# Patient Record
Sex: Male | Born: 1970 | Race: Black or African American | Hispanic: No | Marital: Single | Smoking: Current every day smoker
Health system: Southern US, Community
[De-identification: ages and names within clinical notes are randomized; demographics above are authoritative.]

## PROBLEM LIST (undated history)

## (undated) DIAGNOSIS — I219 Acute myocardial infarction, unspecified: Secondary | ICD-10-CM

## (undated) HISTORY — PX: ANGIOPLASTY: SHX39

---

## 2018-05-25 ENCOUNTER — Encounter (HOSPITAL_COMMUNITY): Payer: Self-pay | Admitting: Emergency Medicine

## 2018-05-25 ENCOUNTER — Emergency Department (HOSPITAL_COMMUNITY)
Admission: EM | Admit: 2018-05-25 | Discharge: 2018-05-25 | Disposition: A | Discharge status: Home / Self Care | Payer: Self-pay | Attending: Emergency Medicine | Admitting: Emergency Medicine

## 2018-05-25 ENCOUNTER — Other Ambulatory Visit: Payer: Self-pay

## 2018-05-25 ENCOUNTER — Emergency Department (HOSPITAL_COMMUNITY): Payer: Self-pay

## 2018-05-25 DIAGNOSIS — I252 Old myocardial infarction: Secondary | ICD-10-CM | POA: Insufficient documentation

## 2018-05-25 DIAGNOSIS — F17210 Nicotine dependence, cigarettes, uncomplicated: Secondary | ICD-10-CM | POA: Insufficient documentation

## 2018-05-25 DIAGNOSIS — R072 Precordial pain: Secondary | ICD-10-CM | POA: Insufficient documentation

## 2018-05-25 HISTORY — DX: Acute myocardial infarction, unspecified: I21.9

## 2018-05-25 LAB — DIFFERENTIAL
Abs Immature Granulocytes: 0.03 10*3/uL (ref 0.00–0.07)
Basophils Absolute: 0 10*3/uL (ref 0.0–0.1)
Basophils Relative: 1 %
Eosinophils Absolute: 0.2 10*3/uL (ref 0.0–0.5)
Eosinophils Relative: 2 %
Immature Granulocytes: 0 %
LYMPHS PCT: 27 %
Lymphs Abs: 1.8 10*3/uL (ref 0.7–4.0)
Monocytes Absolute: 0.6 10*3/uL (ref 0.1–1.0)
Monocytes Relative: 8 %
Neutro Abs: 4.2 10*3/uL (ref 1.7–7.7)
Neutrophils Relative %: 62 %

## 2018-05-25 LAB — I-STAT TROPONIN, ED
Troponin i, poc: 0 ng/mL (ref 0.00–0.08)
Troponin i, poc: 0 ng/mL (ref 0.00–0.08)

## 2018-05-25 LAB — RAPID URINE DRUG SCREEN, HOSP PERFORMED
Amphetamines: NOT DETECTED
BARBITURATES: NOT DETECTED
Benzodiazepines: NOT DETECTED
Cocaine: NOT DETECTED
Opiates: NOT DETECTED
TETRAHYDROCANNABINOL: NOT DETECTED

## 2018-05-25 LAB — COMPREHENSIVE METABOLIC PANEL
ALT: 16 U/L (ref 0–44)
AST: 23 U/L (ref 15–41)
Albumin: 4.2 g/dL (ref 3.5–5.0)
Alkaline Phosphatase: 92 U/L (ref 38–126)
Anion gap: 9 (ref 5–15)
BUN: 11 mg/dL (ref 6–20)
CO2: 26 mmol/L (ref 22–32)
Calcium: 9 mg/dL (ref 8.9–10.3)
Chloride: 105 mmol/L (ref 98–111)
Creatinine, Ser: 1.22 mg/dL (ref 0.61–1.24)
GFR calc Af Amer: >60 mL/min (ref >60)
GFR calc non Af Amer: >60 mL/min (ref >60)
Glucose, Bld: 100 mg/dL — ABNORMAL HIGH (ref 70–99)
Potassium: 3.9 mmol/L (ref 3.5–5.1)
Sodium: 140 mmol/L (ref 135–145)
Total Bilirubin: 0.5 mg/dL (ref 0.3–1.2)
Total Protein: 7 g/dL (ref 6.5–8.1)

## 2018-05-25 LAB — CBC
HCT: 44.1 % (ref 39.0–52.0)
Hemoglobin: 14.4 g/dL (ref 13.0–17.0)
MCH: 28.2 pg (ref 26.0–34.0)
MCHC: 32.7 g/dL (ref 30.0–36.0)
MCV: 86.5 fL (ref 80.0–100.0)
Platelets: 246 10*3/uL (ref 150–400)
RBC: 5.1 MIL/uL (ref 4.22–5.81)
RDW: 14.8 % (ref 11.5–15.5)
WBC: 6.8 10*3/uL (ref 4.0–10.5)
nRBC: 0 % (ref 0.0–0.2)

## 2018-05-25 LAB — LIPASE, BLOOD: Lipase: 38 U/L (ref 11–51)

## 2018-05-25 NOTE — ED Triage Notes (Signed)
Pt reports chest that started yesterday and continued today. Pt reports prior hx of MI with angioplasty. Pt also reporting abdominal pain with diarrhea that started today.

## 2018-05-25 NOTE — ED Provider Notes (Signed)
[facility]-EMERGENCY DEPT Provider Note   CSN: [id number] Arrival date & time: 05/25/18  0543     History   Chief Complaint Chief Complaint  Patient presents with  . Chest Pain  . Abdominal Pain    HPI Randall Higgins is a 48 y.o. male.  Patient with history of myocardial infarction in 2015 in [place], treated with angioplasty, presents emergency department with complaint of chest pressure and abdominal pain starting yesterday.  Patient reports pressure across his mid chest.  At times he will feel stabbing pain in the left lateral chest.  He states that his current symptoms are different than when he had a heart attack.  It was more of a stabbing pain in the middle chest.  Patient also has a tight sensation in the abdomen generally.  No associated fevers, cough, shortness of breath.  No diaphoresis or radiation of pain.  No nausea, vomiting, or diarrhea.  No treatments prior to arrival.  Patient is a smoker.  He states that he is supposed to be on medications but has not had them in about 3 months.  He states that he is currently living in inpatient rehab.  States prior use of crack cocaine.  Denies any current use.  Pain and pressure currently resolved.      Past Medical History:  Diagnosis Date  . MI (myocardial infarction) (HCC)     There are no active problems to display for this patient.   Past Surgical History:  Procedure Laterality Date  . ANGIOPLASTY          Home Medications    Prior to Admission medications   Not on File    Family History History reviewed. No pertinent family history.  Social History Social History   Tobacco Use  . Smoking status: Current Every Day Smoker    Types: Cigarettes  . Smokeless tobacco: Never Used  Substance Use Topics  . Alcohol use: Never    Frequency: Never  . Drug use: Never     Allergies   Patient has no known allergies.   Review of Systems Review of Systems  Constitutional: Negative  for diaphoresis and fever.  Eyes: Negative for redness.  Respiratory: Negative for cough and shortness of breath.   Cardiovascular: Positive for chest pain. Negative for palpitations and leg swelling.  Gastrointestinal: Positive for abdominal pain. Negative for nausea and vomiting.  Genitourinary: Negative for dysuria.  Musculoskeletal: Negative for back pain and neck pain.  Skin: Negative for rash.  Neurological: Negative for syncope and light-headedness.  Psychiatric/Behavioral: The patient is not nervous/anxious.      Physical Exam Updated Vital Signs BP (!) 141/97 (BP Location: Left Arm)   Pulse 82   Temp 97.8 F (36.6 C) (Oral)   Resp 17   Ht 5\' 11"  (1.803 m)   Wt 72.6 kg   SpO2 98%   BMI 22.32 kg/m   Physical Exam Vitals signs and nursing note reviewed.  Constitutional:      Appearance: He is well-developed.  HENT:     Head: Normocephalic and atraumatic.  Eyes:     General:        Right eye: No discharge.        Left eye: No discharge.     Conjunctiva/sclera: Conjunctivae normal.  Neck:     Musculoskeletal: Normal range of motion and neck supple.  Cardiovascular:     Rate and Rhythm: Normal rate and regular rhythm.     Heart sounds: Normal heart  sounds.  Pulmonary:     Effort: Pulmonary effort is normal.     Breath sounds: Normal breath sounds. No decreased breath sounds, wheezing or rhonchi.  Abdominal:     Palpations: Abdomen is soft.     Tenderness: There is no abdominal tenderness.  Skin:    General: Skin is warm and dry.  Neurological:     Mental Status: He is alert.      ED Treatments / Results  Labs (all labs ordered are listed, but only abnormal results are displayed) Labs Reviewed  CBC  COMPREHENSIVE METABOLIC PANEL  LIPASE, BLOOD  DIFFERENTIAL  RAPID URINE DRUG SCREEN, HOSP PERFORMED  I-STAT TROPONIN, ED    EKG EKG Interpretation  Date/Time:  Monday May 25 2018 05:55:20 EST Ventricular Rate:  83 PR Interval:    QRS  Duration: 97 QT Interval:  375 QTC Calculation: 441 R Axis:   64 Text Interpretation:  Sinus rhythm Confirmed by Zadie Rhine ([id number]) on 05/25/2018 5:58:39 AM  Radiology Dg Chest 2 View  Result Date: 05/25/2018 CLINICAL DATA:  48 y/o  M; chest pain, abdominal pain, diarrhea. EXAM: CHEST - 2 VIEW COMPARISON:  None. FINDINGS: The heart size and mediastinal contours are within normal limits. Both lungs are clear. The visualized skeletal structures are unremarkable. IMPRESSION: No acute pulmonary process identified. Electronically Signed   By: Mitzi Hansen M.D.   On: 05/25/2018 06:17    Procedures Procedures (including critical care time)  Medications Ordered in ED Medications - No data to display   Initial Impression / Assessment and Plan / ED Course  I have reviewed the triage vital signs and the nursing notes.  Pertinent labs & imaging results that were available during my care of the patient were reviewed by me and considered in my medical decision making (see chart for details).     Patient seen and examined. Work-up initiated. EKG reviewed.   Vital signs reviewed and are as follows: BP (!) 141/97 (BP Location: Left Arm)   Pulse 82   Temp 97.8 F (36.6 C) (Oral)   Resp 17   Ht 5\' 11"  (1.803 m)   Wt 72.6 kg   SpO2 98%   BMI 22.32 kg/m   10:35 AM delta troponin and repeat EKG are unchanged.  Patient remains pain-free.  Ready for discharged home at this time.  ED ECG REPORT   Date: 05/25/2018  Rate: 81  Rhythm: normal sinus rhythm  QRS Axis: normal  Intervals: normal  ST/T Wave abnormalities: normal  Conduction Disutrbances:none  Narrative Interpretation:   Old EKG Reviewed: unchanged  I have personally reviewed the EKG tracing and agree with the computerized printout as noted.  Health and wellness referral given to establish care and restart medications.  Blood pressure relatively controlled today.  Patient was counseled to return with severe  chest pain, especially if the pain is crushing or pressure-like and spreads to the arms, back, neck, or jaw, or if they have sweating, nausea, or shortness of breath with the pain. They were encouraged to call 911 with these symptoms.   They were also told to return if their chest pain gets worse and does not go away with rest, they have an attack of chest pain lasting longer than usual despite rest and treatment with the medications their caregiver has prescribed, if they wake from sleep with chest pain or shortness of breath, if they feel dizzy or faint, if they have chest pain not typical of their usual pain, or if  they have any other emergent concerns regarding their health.  The patient verbalized understanding and agreed.    Final Clinical Impressions(s) / ED Diagnoses   Final diagnoses:  Precordial pain   Patient with chest pain, atypical symptoms, previous MI.  EKG and troponin negative x2.  Chest x-ray is clear.  Patient now pain-free.  Low concern for ACS, PE, dissection.  No signs of DVT.  No signs of pneumonia, pneumothorax, or other problems.  ED Discharge Orders    None       Renne Crigler, Cordelia Poche 05/25/18 1037    Zadie Rhine, MD 05/25/18 2314

## 2018-05-25 NOTE — Discharge Instructions (Signed)
Please read and follow all provided instructions.  Your diagnoses today include:  1. Precordial pain     Tests performed today include:  An EKG of your heart  A chest x-ray  Cardiac enzymes - a blood test for heart muscle damage  Blood counts and electrolytes  Vital signs. See below for your results today.   Medications prescribed:   None  Take any prescribed medications only as directed.  Follow-up instructions: Please follow-up with your primary care provider as soon as you can for further evaluation of your symptoms.   Return instructions:  SEEK IMMEDIATE MEDICAL ATTENTION IF:  You have severe chest pain, especially if the pain is crushing or pressure-like and spreads to the arms, back, neck, or jaw, or if you have sweating, nausea (feeling sick to your stomach), or shortness of breath. THIS IS AN EMERGENCY. Don't wait to see if the pain will go away. Get medical help at once. Call 911 or [phone] (operator). DO NOT drive yourself to the hospital.   Your chest pain gets worse and does not go away with rest.   You have an attack of chest pain lasting longer than usual, despite rest and treatment with the medications your caregiver has prescribed.   You wake from sleep with chest pain or shortness of breath.  You feel dizzy or faint.  You have chest pain not typical of your usual pain for which you originally saw your caregiver.   You have any other emergent concerns regarding your health.  Additional Information: Chest pain comes from many different causes. Your caregiver has diagnosed you as having chest pain that is not specific for one problem, but does not require admission.  You are at low risk for an acute heart condition or other serious illness.   Your vital signs today were: BP 125/90    Pulse 77    Temp 97.8 F (36.6 C) (Oral)    Resp 12    Ht 5\' 11"  (1.803 m)    Wt 72.6 kg    SpO2 99%    BMI 22.32 kg/m  If your blood pressure (BP) was elevated above 135/85  this visit, please have this repeated by your doctor within one month. --------------

## 2020-08-02 IMAGING — CR DG CHEST 2V
2 series · 2 of 2 positions shown · non-contrast
Comparison: None.

CLINICAL DATA: 47 y/o  M; chest pain, abdominal pain, diarrhea.

EXAM:
CHEST - 2 VIEW

[w chest pa]
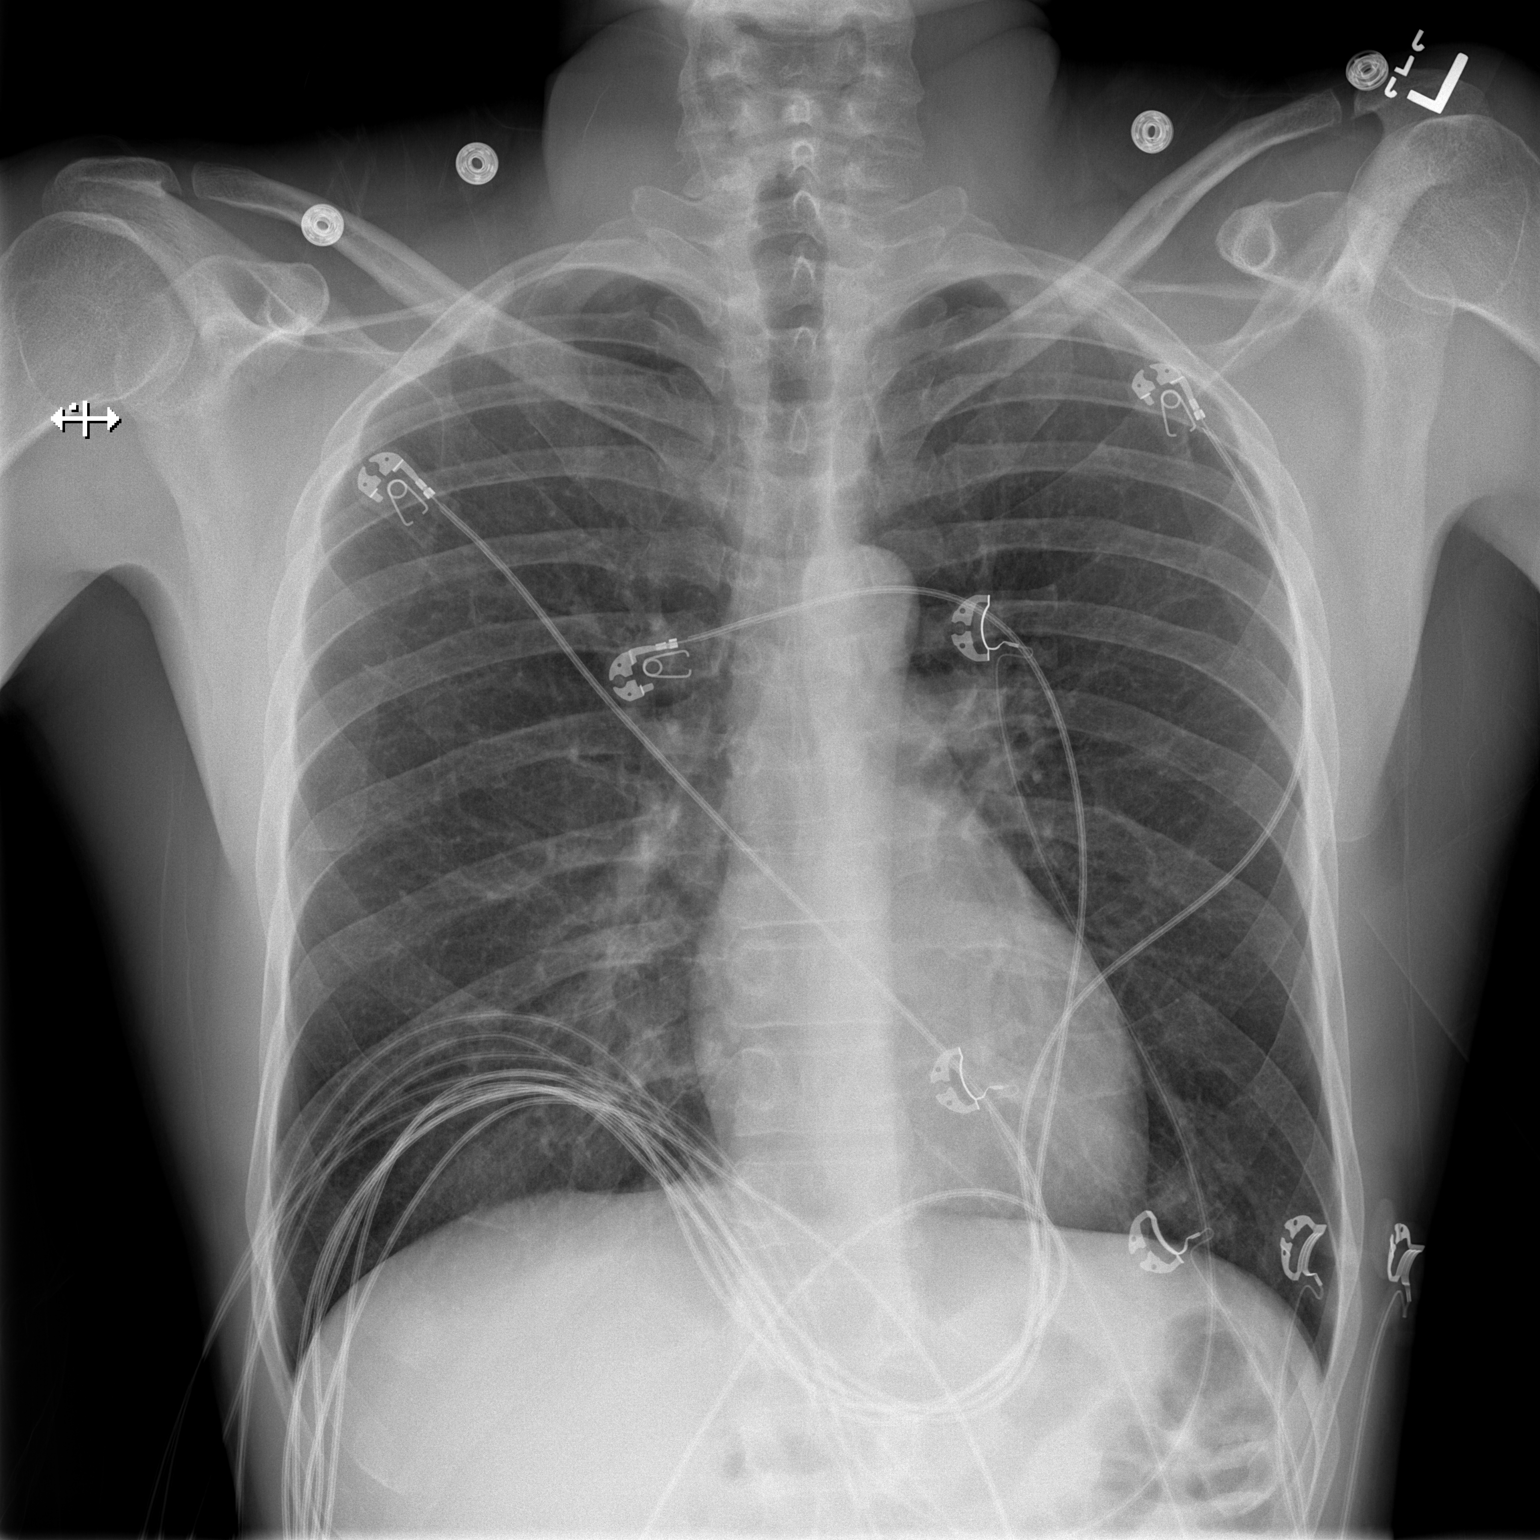

[w chest lat]
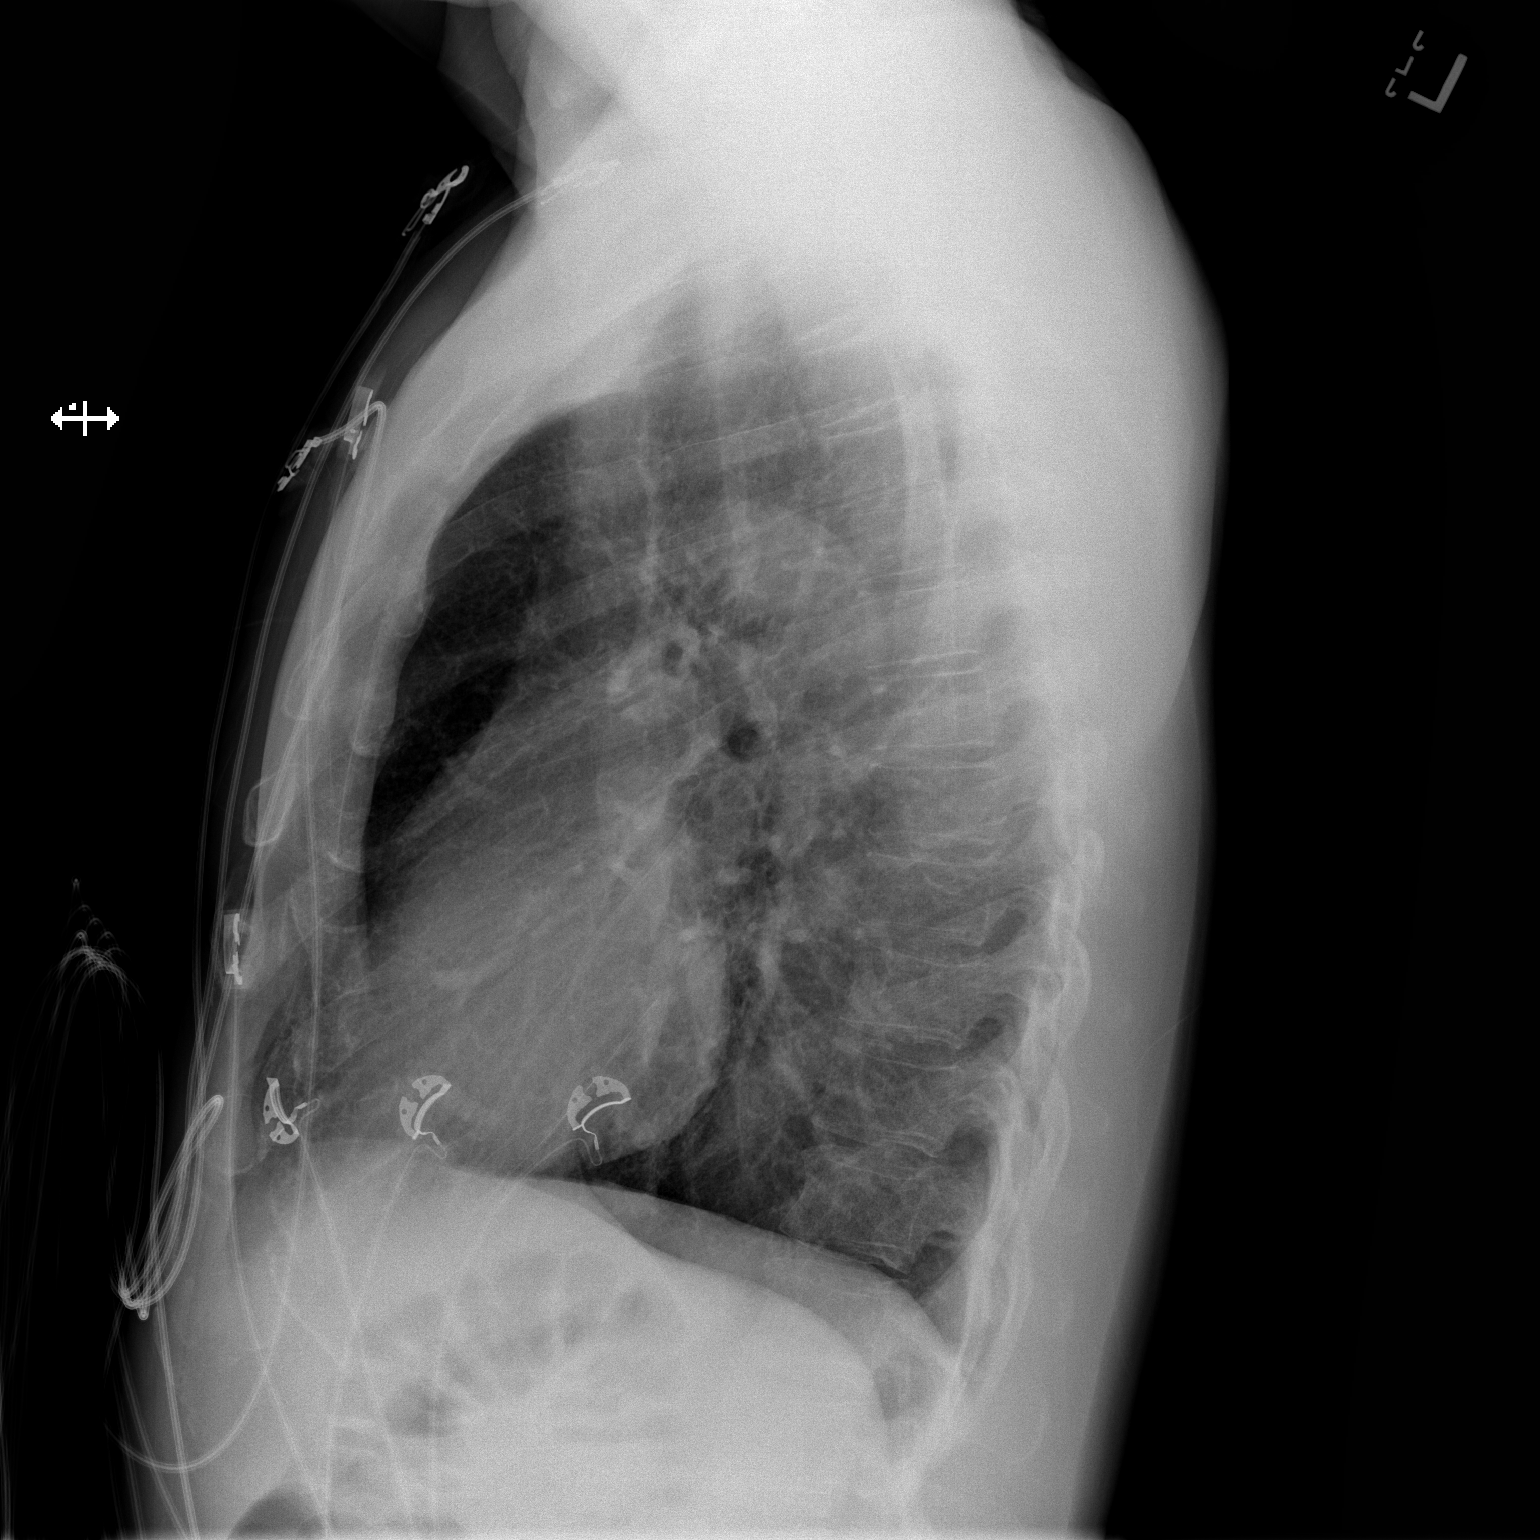

[2 of 2 positions shown; findings below may reference images not displayed]

FINDINGS: The heart size and mediastinal contours are within normal limits.
Both lungs are clear. The visualized skeletal structures are
unremarkable.
IMPRESSION: No acute pulmonary process identified.
